# Patient Record
Sex: Male | Born: 1970 | Race: White | Hispanic: No | Smoking: Former smoker
Health system: Southern US, Community
[De-identification: ages and names within clinical notes are randomized; demographics above are authoritative.]

## PROBLEM LIST (undated history)

## (undated) DIAGNOSIS — I10 Essential (primary) hypertension: Secondary | ICD-10-CM

## (undated) DIAGNOSIS — E119 Type 2 diabetes mellitus without complications: Secondary | ICD-10-CM

---
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
## 2018-04-27 ENCOUNTER — Inpatient Hospital Stay (HOSPITAL_COMMUNITY)
Admission: EM | Admit: 2018-04-27 | Discharge: 2018-05-02 | DRG: 565 | Disposition: A | Discharge status: Rehabilitation Facility | Payer: Self-pay | Attending: Orthopedic Surgery | Admitting: Orthopedic Surgery

## 2018-04-27 ENCOUNTER — Emergency Department (HOSPITAL_COMMUNITY): Payer: Self-pay

## 2018-04-27 ENCOUNTER — Encounter (HOSPITAL_COMMUNITY): Payer: Self-pay | Admitting: Emergency Medicine

## 2018-04-27 ENCOUNTER — Other Ambulatory Visit: Payer: Self-pay

## 2018-04-27 DIAGNOSIS — I10 Essential (primary) hypertension: Secondary | ICD-10-CM | POA: Diagnosis present

## 2018-04-27 DIAGNOSIS — Z87891 Personal history of nicotine dependence: Secondary | ICD-10-CM

## 2018-04-27 DIAGNOSIS — S2231XA Fracture of one rib, right side, initial encounter for closed fracture: Secondary | ICD-10-CM | POA: Diagnosis present

## 2018-04-27 DIAGNOSIS — E1169 Type 2 diabetes mellitus with other specified complication: Secondary | ICD-10-CM

## 2018-04-27 DIAGNOSIS — S40812A Abrasion of left upper arm, initial encounter: Secondary | ICD-10-CM | POA: Diagnosis present

## 2018-04-27 DIAGNOSIS — D62 Acute posthemorrhagic anemia: Secondary | ICD-10-CM

## 2018-04-27 DIAGNOSIS — Z98811 Dental restoration status: Secondary | ICD-10-CM

## 2018-04-27 DIAGNOSIS — T07XXXA Unspecified multiple injuries, initial encounter: Secondary | ICD-10-CM

## 2018-04-27 DIAGNOSIS — E669 Obesity, unspecified: Secondary | ICD-10-CM

## 2018-04-27 DIAGNOSIS — M25511 Pain in right shoulder: Secondary | ICD-10-CM

## 2018-04-27 DIAGNOSIS — S0231XA Fracture of orbital floor, right side, initial encounter for closed fracture: Secondary | ICD-10-CM | POA: Diagnosis present

## 2018-04-27 DIAGNOSIS — Z79899 Other long term (current) drug therapy: Secondary | ICD-10-CM

## 2018-04-27 DIAGNOSIS — S80212A Abrasion, left knee, initial encounter: Secondary | ICD-10-CM | POA: Diagnosis present

## 2018-04-27 DIAGNOSIS — G8918 Other acute postprocedural pain: Secondary | ICD-10-CM

## 2018-04-27 DIAGNOSIS — S80211A Abrasion, right knee, initial encounter: Secondary | ICD-10-CM | POA: Diagnosis present

## 2018-04-27 DIAGNOSIS — S3210XA Unspecified fracture of sacrum, initial encounter for closed fracture: Secondary | ICD-10-CM | POA: Diagnosis present

## 2018-04-27 DIAGNOSIS — S0121XA Laceration without foreign body of nose, initial encounter: Secondary | ICD-10-CM | POA: Diagnosis present

## 2018-04-27 DIAGNOSIS — Z7984 Long term (current) use of oral hypoglycemic drugs: Secondary | ICD-10-CM

## 2018-04-27 DIAGNOSIS — E119 Type 2 diabetes mellitus without complications: Secondary | ICD-10-CM | POA: Diagnosis present

## 2018-04-27 DIAGNOSIS — S42111A Displaced fracture of body of scapula, right shoulder, initial encounter for closed fracture: Principal | ICD-10-CM | POA: Diagnosis present

## 2018-04-27 DIAGNOSIS — R112 Nausea with vomiting, unspecified: Secondary | ICD-10-CM

## 2018-04-27 DIAGNOSIS — Y92410 Unspecified street and highway as the place of occurrence of the external cause: Secondary | ICD-10-CM

## 2018-04-27 DIAGNOSIS — Z23 Encounter for immunization: Secondary | ICD-10-CM

## 2018-04-27 DIAGNOSIS — S40811A Abrasion of right upper arm, initial encounter: Secondary | ICD-10-CM | POA: Diagnosis present

## 2018-04-27 HISTORY — DX: Type 2 diabetes mellitus without complications: E11.9

## 2018-04-27 HISTORY — DX: Essential (primary) hypertension: I10

## 2018-04-27 LAB — COMPREHENSIVE METABOLIC PANEL
ALK PHOS: 42 U/L (ref 38–126)
ALT: 106 U/L — ABNORMAL HIGH (ref 17–63)
AST: 107 U/L — AB (ref 15–41)
Albumin: 3.8 g/dL (ref 3.5–5.0)
Anion gap: 12 (ref 5–15)
BILIRUBIN TOTAL: 0.6 mg/dL (ref 0.3–1.2)
BUN: 16 mg/dL (ref 6–20)
CALCIUM: 9.4 mg/dL (ref 8.9–10.3)
CHLORIDE: 103 mmol/L (ref 101–111)
CO2: 23 mmol/L (ref 22–32)
Creatinine, Ser: 1.25 mg/dL — ABNORMAL HIGH (ref 0.61–1.24)
Glucose, Bld: 164 mg/dL — ABNORMAL HIGH (ref 65–99)
Potassium: 3.5 mmol/L (ref 3.5–5.1)
Sodium: 138 mmol/L (ref 135–145)
Total Protein: 6.8 g/dL (ref 6.5–8.1)

## 2018-04-27 LAB — I-STAT CHEM 8, ED
BUN: 20 mg/dL (ref 6–20)
CHLORIDE: 102 mmol/L (ref 101–111)
Calcium, Ion: 1.17 mmol/L (ref 1.15–1.40)
Creatinine, Ser: 1.1 mg/dL (ref 0.61–1.24)
GLUCOSE: 159 mg/dL — AB (ref 65–99)
HEMATOCRIT: 42 % (ref 39.0–52.0)
Hemoglobin: 14.3 g/dL (ref 13.0–17.0)
POTASSIUM: 3.6 mmol/L (ref 3.5–5.1)
Sodium: 140 mmol/L (ref 135–145)
TCO2: 23 mmol/L (ref 22–32)

## 2018-04-27 LAB — GLUCOSE, CAPILLARY: GLUCOSE-CAPILLARY: 149 mg/dL — AB (ref 65–99)

## 2018-04-27 LAB — CBC
HCT: 40.7 % (ref 39.0–52.0)
Hemoglobin: 13.8 g/dL (ref 13.0–17.0)
MCH: 30.3 pg (ref 26.0–34.0)
MCHC: 33.9 g/dL (ref 30.0–36.0)
MCV: 89.5 fL (ref 78.0–100.0)
PLATELETS: 315 10*3/uL (ref 150–400)
RBC: 4.55 MIL/uL (ref 4.22–5.81)
RDW: 13.1 % (ref 11.5–15.5)
WBC: 17.1 10*3/uL — ABNORMAL HIGH (ref 4.0–10.5)

## 2018-04-27 LAB — CDS SEROLOGY

## 2018-04-27 LAB — PROTIME-INR
INR: 1.03
Prothrombin Time: 13.4 seconds (ref 11.4–15.2)

## 2018-04-27 LAB — ETHANOL: Alcohol, Ethyl (B): <10 mg/dL (ref <10)

## 2018-04-27 LAB — SAMPLE TO BLOOD BANK

## 2018-04-27 MED ORDER — DIPHENHYDRAMINE HCL 50 MG/ML IJ SOLN: 12.5000 mg | Freq: Four times a day (QID) | INTRAMUSCULAR | Status: DC | PRN

## 2018-04-27 MED ORDER — FENTANYL CITRATE (PF) 100 MCG/2ML IJ SOLN
INTRAMUSCULAR | Status: AC
  Filled 2018-04-27: qty 2

## 2018-04-27 MED ORDER — FENTANYL CITRATE (PF) 100 MCG/2ML IJ SOLN
INTRAMUSCULAR | Status: AC
  Administered 2018-04-27: 50 ug
  Filled 2018-04-27: qty 2

## 2018-04-27 MED ORDER — AMLODIPINE BESYLATE 10 MG PO TABS
10.0000 mg | ORAL_TABLET | Freq: Every day | ORAL | Status: DC
  Administered 2018-04-28 – 2018-05-02 (×4): 10 mg via ORAL
  Filled 2018-04-27 (×5): qty 1

## 2018-04-27 MED ORDER — ENOXAPARIN SODIUM 40 MG/0.4ML ~~LOC~~ SOLN
40.0000 mg | SUBCUTANEOUS | Status: DC
  Administered 2018-04-28 – 2018-05-02 (×5): 40 mg via SUBCUTANEOUS
  Filled 2018-04-27 (×5): qty 0.4

## 2018-04-27 MED ORDER — OXYCODONE HCL 5 MG PO TABS
5.0000 mg | ORAL_TABLET | ORAL | Status: DC | PRN
  Administered 2018-04-28 – 2018-05-02 (×10): 10 mg via ORAL
  Filled 2018-04-27 (×10): qty 2

## 2018-04-27 MED ORDER — ONDANSETRON HCL 4 MG/2ML IJ SOLN
4.0000 mg | Freq: Once | INTRAMUSCULAR | Status: DC
  Filled 2018-04-27: qty 2

## 2018-04-27 MED ORDER — KCL IN DEXTROSE-NACL 20-5-0.45 MEQ/L-%-% IV SOLN
INTRAVENOUS | Status: DC
  Administered 2018-04-27 – 2018-05-01 (×10): via INTRAVENOUS
  Filled 2018-04-27 (×11): qty 1000

## 2018-04-27 MED ORDER — BACITRACIN ZINC 500 UNIT/GM EX OINT
TOPICAL_OINTMENT | Freq: Two times a day (BID) | CUTANEOUS | Status: DC
  Administered 2018-04-27 – 2018-04-29 (×5): via TOPICAL
  Administered 2018-04-30: 1 via TOPICAL
  Administered 2018-04-30 – 2018-05-02 (×4): via TOPICAL
  Filled 2018-04-27 (×2): qty 28.35

## 2018-04-27 MED ORDER — LIDOCAINE-EPINEPHRINE (PF) 2 %-1:200000 IJ SOLN
10.0000 mL | Freq: Once | INTRAMUSCULAR | Status: AC
  Administered 2018-04-27: 10 mL

## 2018-04-27 MED ORDER — DOCUSATE SODIUM 100 MG PO CAPS
100.0000 mg | ORAL_CAPSULE | Freq: Two times a day (BID) | ORAL | Status: DC
  Administered 2018-04-28 – 2018-05-02 (×8): 100 mg via ORAL
  Filled 2018-04-27 (×9): qty 1

## 2018-04-27 MED ORDER — INSULIN ASPART 100 UNIT/ML ~~LOC~~ SOLN
0.0000 [IU] | Freq: Three times a day (TID) | SUBCUTANEOUS | Status: DC
  Administered 2018-04-28: 2 [IU] via SUBCUTANEOUS
  Administered 2018-04-28: 3 [IU] via SUBCUTANEOUS
  Administered 2018-04-28 – 2018-05-02 (×7): 2 [IU] via SUBCUTANEOUS

## 2018-04-27 MED ORDER — TRAMADOL HCL 50 MG PO TABS
50.0000 mg | ORAL_TABLET | Freq: Four times a day (QID) | ORAL | Status: DC | PRN
  Administered 2018-04-28: 50 mg via ORAL
  Filled 2018-04-27: qty 1

## 2018-04-27 MED ORDER — LOSARTAN POTASSIUM 50 MG PO TABS
100.0000 mg | ORAL_TABLET | Freq: Every day | ORAL | Status: DC
  Administered 2018-04-28 – 2018-05-02 (×5): 100 mg via ORAL
  Filled 2018-04-27 (×5): qty 2

## 2018-04-27 MED ORDER — CEFAZOLIN SODIUM-DEXTROSE 2-4 GM/100ML-% IV SOLN
2.0000 g | Freq: Three times a day (TID) | INTRAVENOUS | Status: AC
  Administered 2018-04-27 – 2018-04-28 (×3): 2 g via INTRAVENOUS
  Filled 2018-04-27 (×3): qty 100

## 2018-04-27 MED ORDER — ACETAMINOPHEN 325 MG PO TABS: 650.0000 mg | ORAL_TABLET | Freq: Four times a day (QID) | ORAL | Status: DC | PRN

## 2018-04-27 MED ORDER — HYDROMORPHONE HCL 2 MG/ML IJ SOLN
0.5000 mg | Freq: Once | INTRAMUSCULAR | Status: AC
  Administered 2018-04-27: 0.5 mg via INTRAVENOUS
  Filled 2018-04-27: qty 1

## 2018-04-27 MED ORDER — HYDROCHLOROTHIAZIDE 25 MG PO TABS
25.0000 mg | ORAL_TABLET | Freq: Every day | ORAL | Status: DC
  Administered 2018-04-28 – 2018-05-02 (×5): 25 mg via ORAL
  Filled 2018-04-27 (×5): qty 1

## 2018-04-27 MED ORDER — ONDANSETRON HCL 4 MG/2ML IJ SOLN
4.0000 mg | Freq: Four times a day (QID) | INTRAMUSCULAR | Status: DC | PRN
  Administered 2018-04-29 – 2018-05-01 (×2): 4 mg via INTRAVENOUS
  Filled 2018-04-27 (×2): qty 2

## 2018-04-27 MED ORDER — HYDROMORPHONE HCL 2 MG/ML IJ SOLN
0.5000 mg | INTRAMUSCULAR | Status: DC | PRN
  Administered 2018-04-27: 2 mg via INTRAVENOUS
  Filled 2018-04-27: qty 1

## 2018-04-27 MED ORDER — ONDANSETRON 4 MG PO TBDP: 4.0000 mg | ORAL_TABLET | Freq: Four times a day (QID) | ORAL | Status: DC | PRN

## 2018-04-27 MED ORDER — FENTANYL CITRATE (PF) 100 MCG/2ML IJ SOLN
50.0000 ug | Freq: Once | INTRAMUSCULAR | Status: AC
  Administered 2018-04-27: 50 ug via INTRAVENOUS

## 2018-04-27 MED ORDER — BISACODYL 10 MG RE SUPP: 10.0000 mg | Freq: Every day | RECTAL | Status: DC | PRN

## 2018-04-27 MED ORDER — IOHEXOL 300 MG/ML  SOLN
100.0000 mL | Freq: Once | INTRAMUSCULAR | Status: AC | PRN
  Administered 2018-04-27: 100 mL via INTRAVENOUS

## 2018-04-27 MED ORDER — LIDOCAINE-EPINEPHRINE 1 %-1:100000 IJ SOLN
20.0000 mL | Freq: Once | INTRAMUSCULAR | Status: DC
  Filled 2018-04-27: qty 20

## 2018-04-27 MED ORDER — HYDRALAZINE HCL 20 MG/ML IJ SOLN: 10.0000 mg | INTRAMUSCULAR | Status: DC | PRN

## 2018-04-27 MED ORDER — PRAVASTATIN SODIUM 40 MG PO TABS
40.0000 mg | ORAL_TABLET | Freq: Every day | ORAL | Status: DC
  Administered 2018-04-28 – 2018-05-01 (×4): 40 mg via ORAL
  Filled 2018-04-27 (×4): qty 1

## 2018-04-27 MED ORDER — FENOFIBRATE 54 MG PO TABS
54.0000 mg | ORAL_TABLET | Freq: Every day | ORAL | Status: DC
  Administered 2018-04-28 – 2018-05-02 (×5): 54 mg via ORAL
  Filled 2018-04-27 (×5): qty 1
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
## 2018-04-27 NOTE — ED Notes (Signed)
Report given to Tor Netters RN

## 2018-04-27 NOTE — ED Notes (Signed)
Pt to CT with transporter.
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
## 2018-04-27 NOTE — ED Notes (Signed)
Sling placed on patient's right arm.
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
## 2018-04-27 NOTE — ED Notes (Signed)
Report given to [facility]. Opportunity for questions provided.  RN aware that he is being transferred to floor.

## 2018-04-27 NOTE — Progress Notes (Signed)
Orthopedic Tech Progress Note Patient Details:  Dustin Alexander September 20, 1971 [id number]  Ortho Devices Type of Ortho Device: Arm sling Ortho Device/Splint Location: rue. pt still needed to have wounds dressed so i left arm sling in room and informed rn to this. Ortho Device/Splint Interventions: Rich Brave 04/27/2018, 8:49 PM

## 2018-04-27 NOTE — Progress Notes (Signed)
Chaplain visited with the PT and helped him dial his cell phone.  Both Attending and Chaplain spoke with wife who is traveling from [place].

## 2018-04-28 ENCOUNTER — Encounter (HOSPITAL_COMMUNITY): Payer: Self-pay | Admitting: General Practice

## 2018-04-28 ENCOUNTER — Encounter (HOSPITAL_COMMUNITY): Payer: Self-pay

## 2018-04-28 LAB — GLUCOSE, CAPILLARY
GLUCOSE-CAPILLARY: 136 mg/dL — AB (ref 65–99)
Glucose-Capillary: 143 mg/dL — ABNORMAL HIGH (ref 65–99)
Glucose-Capillary: 168 mg/dL — ABNORMAL HIGH (ref 65–99)
Glucose-Capillary: 143 mg/dL — ABNORMAL HIGH (ref 65–99)

## 2018-04-28 LAB — CBC
HEMATOCRIT: 36 % — AB (ref 39.0–52.0)
HEMOGLOBIN: 12.3 g/dL — AB (ref 13.0–17.0)
MCH: 30.4 pg (ref 26.0–34.0)
MCHC: 34.2 g/dL (ref 30.0–36.0)
MCV: 89.1 fL (ref 78.0–100.0)
Platelets: 240 10*3/uL (ref 150–400)
RBC: 4.04 MIL/uL — ABNORMAL LOW (ref 4.22–5.81)
RDW: 13.4 % (ref 11.5–15.5)
WBC: 9.7 10*3/uL (ref 4.0–10.5)

## 2018-04-28 LAB — BASIC METABOLIC PANEL
Anion gap: 10 (ref 5–15)
BUN: 19 mg/dL (ref 6–20)
CALCIUM: 9 mg/dL (ref 8.9–10.3)
CHLORIDE: 102 mmol/L (ref 101–111)
CO2: 25 mmol/L (ref 22–32)
CREATININE: 1.1 mg/dL (ref 0.61–1.24)
GFR calc non Af Amer: >60 mL/min (ref >60)
Glucose, Bld: 150 mg/dL — ABNORMAL HIGH (ref 65–99)
Potassium: 3.6 mmol/L (ref 3.5–5.1)
Sodium: 137 mmol/L (ref 135–145)

## 2018-04-28 LAB — HIV ANTIBODY (ROUTINE TESTING W REFLEX): HIV Screen 4th Generation wRfx: NONREACTIVE

## 2018-04-28 MED ORDER — HYDROMORPHONE HCL 2 MG/ML IJ SOLN
1.0000 mg | INTRAMUSCULAR | Status: DC | PRN
  Administered 2018-04-28: 1 mg via INTRAVENOUS
  Filled 2018-04-28: qty 1

## 2018-04-28 MED ORDER — ACETAMINOPHEN 325 MG PO TABS
650.0000 mg | ORAL_TABLET | Freq: Four times a day (QID) | ORAL | Status: DC
  Administered 2018-04-28 – 2018-05-02 (×17): 650 mg via ORAL
  Filled 2018-04-28 (×18): qty 2

## 2018-04-28 MED ORDER — TRAMADOL HCL 50 MG PO TABS
50.0000 mg | ORAL_TABLET | Freq: Four times a day (QID) | ORAL | Status: DC
  Administered 2018-04-28 – 2018-05-02 (×16): 50 mg via ORAL
  Filled 2018-04-28 (×16): qty 1

## 2018-04-28 NOTE — Progress Notes (Signed)
Orthopedic Tech Progress Note Patient Details:  Dustin Alexander Oct 25, 1971 [id number]  Ortho Devices Type of Ortho Device: Sling immobilizer Ortho Device/Splint Location: rue. pt still needed to have wounds dressed so i left arm sling in room and informed rn to this. Ortho Device/Splint Interventions: Ordered   Post Interventions Instructions Provided: Care of device   Saul Fordyce 04/28/2018, 11:03 AM
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
## 2018-04-28 NOTE — Progress Notes (Signed)
Orthopedic Tech Progress Note Patient Details:  Dustin Alexander December 21, 1971 [id number] Patient unable to use frame. Patient ID: Dustin Alexander, male   DOB: January 06, 1971, 47 y.o.   MRN: [id number]   [name] 04/28/2018, 7:35 PM
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
## 2018-04-28 NOTE — Progress Notes (Signed)
Received pt alert and oriented. Pt with sling to right arm, gauze dressing to right hand. Abrasions noted to back,left arms a sacral area, bilateral knees and toes.
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
## 2018-04-29 ENCOUNTER — Encounter (HOSPITAL_COMMUNITY): Payer: Self-pay | Admitting: Physical Medicine and Rehabilitation

## 2018-04-29 DIAGNOSIS — S42191S Fracture of other part of scapula, right shoulder, sequela: Secondary | ICD-10-CM

## 2018-04-29 DIAGNOSIS — S3219XS Other fracture of sacrum, sequela: Secondary | ICD-10-CM

## 2018-04-29 LAB — GLUCOSE, CAPILLARY
GLUCOSE-CAPILLARY: 115 mg/dL — AB (ref 65–99)
GLUCOSE-CAPILLARY: 123 mg/dL — AB (ref 65–99)
Glucose-Capillary: 124 mg/dL — ABNORMAL HIGH (ref 65–99)
Glucose-Capillary: 134 mg/dL — ABNORMAL HIGH (ref 65–99)
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
## 2018-04-30 LAB — GLUCOSE, CAPILLARY
Glucose-Capillary: 117 mg/dL — ABNORMAL HIGH (ref 65–99)
Glucose-Capillary: 145 mg/dL — ABNORMAL HIGH (ref 65–99)
Glucose-Capillary: 116 mg/dL — ABNORMAL HIGH (ref 65–99)
Glucose-Capillary: 129 mg/dL — ABNORMAL HIGH (ref 65–99)

## 2018-04-30 MED ORDER — METHOCARBAMOL 500 MG PO TABS
1000.0000 mg | ORAL_TABLET | Freq: Four times a day (QID) | ORAL | Status: DC | PRN
  Administered 2018-04-30 – 2018-05-02 (×4): 1000 mg via ORAL
  Filled 2018-04-30 (×4): qty 2
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
## 2018-04-30 NOTE — Progress Notes (Deleted)
Subjective:  Patient comfortable.  In a collar.   Objective: Vital signs in last 24 hours: Temp:  [98.4 F (36.9 C)-99.1 F (37.3 C)] 99.1 F (37.3 C) (05/11 0625) Pulse Rate:  [75-81] 81 (05/11 0625) BP: (103-105)/(62-67) 105/67 (05/11 0625) SpO2:  [95 %-96 %] 95 % (05/11 0625)  Intake/Output from previous day: 05/10 0701 - 05/11 0700 In: 3200 [P.O.:300; I.V.:2900] Out: 2600 [Urine:2600] Intake/Output this shift: No intake/output data recorded.  Recent Labs    04/27/18 1434 04/27/18 1455 04/28/18 0719  HGB 13.8 14.3 12.3*   Recent Labs    04/27/18 1434 04/27/18 1455 04/28/18 0719  WBC 17.1*  --  9.7  RBC 4.55  --  4.04*  HCT 40.7 42.0 36.0*  PLT 315  --  240   Recent Labs    04/27/18 1434 04/27/18 1455 04/28/18 0719  NA 138 140 137  K 3.5 3.6 3.6  CL 103 102 102  CO2 23  --  25  BUN CREATININE 1.25* 1.10 1.10  GLUCOSE 164* 159* 150*  CALCIUM 9.4  --  9.0   Recent Labs    04/27/18 1434  INR 1.03    External fixator on the left tibial plateau fracture.  Dressing applied to it.  There is some mild bloody drainage posteriorly.  Good capillary refill.  Good sensation distally.  Able dorsiflex and plantarflex.  No pain with passive dorsiflexion or plantar flexion of the left foot.  Right knee moderate effusion.  No pain with passive dorsiflexion and plantarflexion of the foot or the great toe.  Compartment soft.  Good sensation distally on the right and good capillary refill.  Good pulses.      Assessment/Plan:  Patient status post I&D and application of external fixator for open comminuted left proximal tibia fracture.  Patient neurovascular intact without evidence of a compartment syndrome on the left.  On the right is a moderate knee effusion.  No evidence of a compartment syndrome. Patient is currently in a collar pending MRI of the cervical spine for further evaluation by neurosurgery.  I discussed local wound care with the nurse.   He has multiple abrasions in the upper extremities.  Continue monitoring.   Nyla Creason C 04/30/2018, 11:40 AM

## 2018-05-01 LAB — GLUCOSE, CAPILLARY
GLUCOSE-CAPILLARY: 119 mg/dL — AB (ref 65–99)
GLUCOSE-CAPILLARY: 127 mg/dL — AB (ref 65–99)
GLUCOSE-CAPILLARY: 167 mg/dL — AB (ref 65–99)
Glucose-Capillary: 143 mg/dL — ABNORMAL HIGH (ref 65–99)

## 2018-05-01 NOTE — Progress Notes (Signed)
Subjective: [name] without new complaints. Seems fairly comfortable on rounds.   Objective: Vital signs in last 24 hours: Temp:  [98.4 F (36.9 C)-100 F (37.8 C)] 98.4 F (36.9 C) (05/12 0509) Pulse Rate:  [69-86] 69 (05/12 0509) Resp:  [16] 16 (05/11 2248) BP: (114-130)/(72-74) 125/74 (05/12 0509) SpO2:  [92 %-97 %] 97 % (05/12 0509)  Intake/Output from previous day: 05/11 0701 - 05/12 0700 In: -  Out: 1600 [Urine:1600] Intake/Output this shift: Total I/O In: 300 [P.O.:300] Out: -   No results for input(s): HGB in the last 72 hours. No results for input(s): WBC, RBC, HCT, PLT in the last 72 hours. No results for input(s): NA, K, CL, CO2, BUN, CREATININE, GLUCOSE, CALCIUM in the last 72 hours. No results for input(s): LABPT, INR in the last 72 hours.  Sling inplace . Shoulder and hips sore . Tolerating therapies  well.     Assessment/Plan: Please see DR Ranell Patrick note yesterday from 5/10   No change. Dustin Alexander 05/01/2018, 10:27 AM

## 2018-05-02 ENCOUNTER — Inpatient Hospital Stay (HOSPITAL_COMMUNITY)
Admission: RE | Admit: 2018-05-02 | Discharge: 2018-05-07 | DRG: 560 | Disposition: A | Discharge status: Home / Self Care | Payer: Self-pay | Source: Intra-hospital | Attending: Physical Medicine & Rehabilitation | Admitting: Physical Medicine & Rehabilitation

## 2018-05-02 ENCOUNTER — Encounter (HOSPITAL_COMMUNITY): Payer: Self-pay | Admitting: *Deleted

## 2018-05-02 ENCOUNTER — Other Ambulatory Visit: Payer: Self-pay

## 2018-05-02 DIAGNOSIS — I10 Essential (primary) hypertension: Secondary | ICD-10-CM | POA: Diagnosis present

## 2018-05-02 DIAGNOSIS — S80212D Abrasion, left knee, subsequent encounter: Secondary | ICD-10-CM

## 2018-05-02 DIAGNOSIS — R7989 Other specified abnormal findings of blood chemistry: Secondary | ICD-10-CM

## 2018-05-02 DIAGNOSIS — S42102D Fracture of unspecified part of scapula, left shoulder, subsequent encounter for fracture with routine healing: Secondary | ICD-10-CM

## 2018-05-02 DIAGNOSIS — E1169 Type 2 diabetes mellitus with other specified complication: Secondary | ICD-10-CM | POA: Diagnosis present

## 2018-05-02 DIAGNOSIS — Z7984 Long term (current) use of oral hypoglycemic drugs: Secondary | ICD-10-CM

## 2018-05-02 DIAGNOSIS — Z6830 Body mass index (BMI) 30.0-30.9, adult: Secondary | ICD-10-CM

## 2018-05-02 DIAGNOSIS — S0121XD Laceration without foreign body of nose, subsequent encounter: Secondary | ICD-10-CM

## 2018-05-02 DIAGNOSIS — E669 Obesity, unspecified: Secondary | ICD-10-CM

## 2018-05-02 DIAGNOSIS — T07XXXA Unspecified multiple injuries, initial encounter: Secondary | ICD-10-CM

## 2018-05-02 DIAGNOSIS — S80211D Abrasion, right knee, subsequent encounter: Secondary | ICD-10-CM

## 2018-05-02 DIAGNOSIS — S3210XD Unspecified fracture of sacrum, subsequent encounter for fracture with routine healing: Secondary | ICD-10-CM

## 2018-05-02 DIAGNOSIS — S20411D Abrasion of right back wall of thorax, subsequent encounter: Secondary | ICD-10-CM

## 2018-05-02 DIAGNOSIS — S0231XD Fracture of orbital floor, right side, subsequent encounter for fracture with routine healing: Secondary | ICD-10-CM

## 2018-05-02 DIAGNOSIS — S3282XD Multiple fractures of pelvis without disruption of pelvic ring, subsequent encounter for fracture with routine healing: Principal | ICD-10-CM

## 2018-05-02 DIAGNOSIS — E119 Type 2 diabetes mellitus without complications: Secondary | ICD-10-CM | POA: Diagnosis present

## 2018-05-02 DIAGNOSIS — S50811D Abrasion of right forearm, subsequent encounter: Secondary | ICD-10-CM

## 2018-05-02 DIAGNOSIS — R945 Abnormal results of liver function studies: Secondary | ICD-10-CM | POA: Diagnosis present

## 2018-05-02 DIAGNOSIS — R112 Nausea with vomiting, unspecified: Secondary | ICD-10-CM

## 2018-05-02 DIAGNOSIS — D62 Acute posthemorrhagic anemia: Secondary | ICD-10-CM | POA: Diagnosis present

## 2018-05-02 DIAGNOSIS — S2231XD Fracture of one rib, right side, subsequent encounter for fracture with routine healing: Secondary | ICD-10-CM

## 2018-05-02 DIAGNOSIS — G8918 Other acute postprocedural pain: Secondary | ICD-10-CM

## 2018-05-02 DIAGNOSIS — Z87891 Personal history of nicotine dependence: Secondary | ICD-10-CM

## 2018-05-02 DIAGNOSIS — K5901 Slow transit constipation: Secondary | ICD-10-CM | POA: Diagnosis present

## 2018-05-02 DIAGNOSIS — Y92410 Unspecified street and highway as the place of occurrence of the external cause: Secondary | ICD-10-CM

## 2018-05-02 DIAGNOSIS — S50812D Abrasion of left forearm, subsequent encounter: Secondary | ICD-10-CM

## 2018-05-02 DIAGNOSIS — S3282XA Multiple fractures of pelvis without disruption of pelvic ring, initial encounter for closed fracture: Secondary | ICD-10-CM | POA: Diagnosis present

## 2018-05-02 LAB — GLUCOSE, CAPILLARY
GLUCOSE-CAPILLARY: 125 mg/dL — AB (ref 65–99)
Glucose-Capillary: 119 mg/dL — ABNORMAL HIGH (ref 65–99)
Glucose-Capillary: 113 mg/dL — ABNORMAL HIGH (ref 65–99)
Glucose-Capillary: 120 mg/dL — ABNORMAL HIGH (ref 65–99)

## 2018-05-02 MED ORDER — INSULIN ASPART 100 UNIT/ML ~~LOC~~ SOLN
0.0000 [IU] | Freq: Three times a day (TID) | SUBCUTANEOUS | Status: DC
  Administered 2018-05-03 – 2018-05-06 (×4): 2 [IU] via SUBCUTANEOUS
  Administered 2018-05-07: 3 [IU] via SUBCUTANEOUS

## 2018-05-02 MED ORDER — ENOXAPARIN SODIUM 40 MG/0.4ML ~~LOC~~ SOLN: 40.0000 mg | SUBCUTANEOUS | Status: DC

## 2018-05-02 MED ORDER — ALUM & MAG HYDROXIDE-SIMETH 200-200-20 MG/5ML PO SUSP: 30.0000 mL | ORAL | Status: DC | PRN

## 2018-05-02 MED ORDER — PROCHLORPERAZINE 25 MG RE SUPP: 12.5000 mg | Freq: Four times a day (QID) | RECTAL | Status: DC | PRN

## 2018-05-02 MED ORDER — PROCHLORPERAZINE MALEATE 5 MG PO TABS: 5.0000 mg | ORAL_TABLET | Freq: Four times a day (QID) | ORAL | Status: DC | PRN

## 2018-05-02 MED ORDER — COLLAGENASE 250 UNIT/GM EX OINT
TOPICAL_OINTMENT | Freq: Every day | CUTANEOUS | Status: DC
  Administered 2018-05-02: 18:00:00 via TOPICAL
  Administered 2018-05-03: 1 via TOPICAL
  Administered 2018-05-04 – 2018-05-06 (×2): via TOPICAL
  Filled 2018-05-02 (×2): qty 30

## 2018-05-02 MED ORDER — LOSARTAN POTASSIUM 50 MG PO TABS
100.0000 mg | ORAL_TABLET | Freq: Every day | ORAL | Status: DC
  Administered 2018-05-03 – 2018-05-07 (×5): 100 mg via ORAL
  Filled 2018-05-02 (×5): qty 2

## 2018-05-02 MED ORDER — FENOFIBRATE 54 MG PO TABS
54.0000 mg | ORAL_TABLET | Freq: Every day | ORAL | Status: DC
  Administered 2018-05-03 – 2018-05-07 (×5): 54 mg via ORAL
  Filled 2018-05-02 (×6): qty 1

## 2018-05-02 MED ORDER — HYDROCHLOROTHIAZIDE 25 MG PO TABS
25.0000 mg | ORAL_TABLET | Freq: Every day | ORAL | Status: DC
  Administered 2018-05-03 – 2018-05-07 (×5): 25 mg via ORAL
  Filled 2018-05-02 (×5): qty 1

## 2018-05-02 MED ORDER — BACITRACIN ZINC 500 UNIT/GM EX OINT
TOPICAL_OINTMENT | Freq: Two times a day (BID) | CUTANEOUS | Status: DC
  Administered 2018-05-03: 1 via TOPICAL
  Administered 2018-05-03 – 2018-05-07 (×8): via TOPICAL
  Filled 2018-05-02 (×3): qty 28.35

## 2018-05-02 MED ORDER — OXYCODONE HCL 5 MG PO TABS
5.0000 mg | ORAL_TABLET | ORAL | Status: DC | PRN
  Administered 2018-05-02 – 2018-05-07 (×7): 10 mg via ORAL
  Filled 2018-05-02 (×7): qty 2

## 2018-05-02 MED ORDER — METHOCARBAMOL 500 MG PO TABS
1000.0000 mg | ORAL_TABLET | Freq: Four times a day (QID) | ORAL | Status: DC | PRN
  Administered 2018-05-03: 1000 mg via ORAL
  Filled 2018-05-02: qty 2

## 2018-05-02 MED ORDER — POLYETHYLENE GLYCOL 3350 17 G PO PACK: 17.0000 g | PACK | Freq: Every day | ORAL | Status: DC | PRN

## 2018-05-02 MED ORDER — PREMIER PROTEIN SHAKE
11.0000 [oz_av] | Freq: Three times a day (TID) | ORAL | Status: DC
  Administered 2018-05-02 – 2018-05-06 (×13): 11 [oz_av] via ORAL
  Filled 2018-05-02 (×34): qty 325.31

## 2018-05-02 MED ORDER — DIPHENHYDRAMINE HCL 12.5 MG/5ML PO ELIX: 12.5000 mg | ORAL_SOLUTION | Freq: Four times a day (QID) | ORAL | Status: DC | PRN

## 2018-05-02 MED ORDER — PROCHLORPERAZINE EDISYLATE 10 MG/2ML IJ SOLN: 5.0000 mg | Freq: Four times a day (QID) | INTRAMUSCULAR | Status: DC | PRN

## 2018-05-02 MED ORDER — POLYETHYLENE GLYCOL 3350 17 G PO PACK
17.0000 g | PACK | Freq: Two times a day (BID) | ORAL | Status: DC
  Administered 2018-05-03 – 2018-05-07 (×9): 17 g via ORAL
  Filled 2018-05-02 (×10): qty 1

## 2018-05-02 MED ORDER — FLEET ENEMA 7-19 GM/118ML RE ENEM: 1.0000 | ENEMA | Freq: Once | RECTAL | Status: DC | PRN

## 2018-05-02 MED ORDER — TRAZODONE HCL 50 MG PO TABS: 25.0000 mg | ORAL_TABLET | Freq: Every evening | ORAL | Status: DC | PRN

## 2018-05-02 MED ORDER — PRAVASTATIN SODIUM 40 MG PO TABS
40.0000 mg | ORAL_TABLET | Freq: Every day | ORAL | Status: DC
  Administered 2018-05-02 – 2018-05-06 (×5): 40 mg via ORAL
  Filled 2018-05-02 (×6): qty 1

## 2018-05-02 MED ORDER — GUAIFENESIN-DM 100-10 MG/5ML PO SYRP: 5.0000 mL | ORAL_SOLUTION | Freq: Four times a day (QID) | ORAL | Status: DC | PRN

## 2018-05-02 MED ORDER — ENOXAPARIN SODIUM 40 MG/0.4ML ~~LOC~~ SOLN
40.0000 mg | SUBCUTANEOUS | Status: DC
  Administered 2018-05-03 – 2018-05-07 (×5): 40 mg via SUBCUTANEOUS
  Filled 2018-05-02 (×5): qty 0.4

## 2018-05-02 MED ORDER — AMLODIPINE BESYLATE 10 MG PO TABS
10.0000 mg | ORAL_TABLET | Freq: Every day | ORAL | Status: DC
  Administered 2018-05-03 – 2018-05-07 (×5): 10 mg via ORAL
  Filled 2018-05-02 (×5): qty 1

## 2018-05-02 MED ORDER — TRAMADOL HCL 50 MG PO TABS
50.0000 mg | ORAL_TABLET | Freq: Four times a day (QID) | ORAL | Status: DC
  Administered 2018-05-02 – 2018-05-06 (×16): 50 mg via ORAL
  Filled 2018-05-02 (×16): qty 1

## 2018-05-02 MED ORDER — POLYETHYLENE GLYCOL 3350 17 G PO PACK
17.0000 g | PACK | Freq: Once | ORAL | Status: AC
  Administered 2018-05-02: 17 g via ORAL
  Filled 2018-05-02: qty 1

## 2018-05-02 MED ORDER — ACETAMINOPHEN 325 MG PO TABS
650.0000 mg | ORAL_TABLET | Freq: Four times a day (QID) | ORAL | Status: DC
  Administered 2018-05-02 – 2018-05-07 (×17): 650 mg via ORAL
  Filled 2018-05-02 (×18): qty 2

## 2018-05-02 MED ORDER — BISACODYL 10 MG RE SUPP: 10.0000 mg | Freq: Every day | RECTAL | Status: DC | PRN
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
## 2018-05-02 NOTE — Progress Notes (Signed)
Orthopedic Tech Progress Note Patient Details:  Dustin Alexander 10/10/1971 [id number]  Ortho Devices Type of Ortho Device: Arm sling Ortho Device/Splint Location: rue. pt still needed to have wounds dressed so i left arm sling in room and informed rn to this. Ortho Device/Splint Interventions: Ordered   Post Interventions Patient Tolerated: Unable to use device properly Instructions Provided: Care of device   Saul Fordyce 05/02/2018, 2:17 PM
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
## 2018-05-02 NOTE — Progress Notes (Signed)
Patient ID: Dustin Alexander, male   DOB: 04/19/1971, 47 y.o.   MRN: [id number] Patient arrived from [facility] with patient belongings and family. Patient oriented to room, rehab process, rehab schedule, fall prevention plan, rehab safety plan, and health resource notebook with verbal understanding. Patient A & OX4.
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
## 2018-05-02 NOTE — Progress Notes (Addendum)
Inpatient Rehabilitation  Met with patient and spouse at bedside to further discuss IP Rehab.  They are in agreement with plan for IP Rehab stay prior to returning home with his mother-in-law and father-in-law to assist while spouse works.  Plan to follow for timing of medical readiness and IP Rehab bed availability.  Call if questions.    Update: I have received medical clearance and have a bed to offer today.  Plan to proceed with IP Rehab admission today.   Carmelia Roller., CCC/SLP Admission Coordinator  [place]  Cell [phone]
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
## 2018-05-03 ENCOUNTER — Encounter (HOSPITAL_COMMUNITY): Payer: Self-pay

## 2018-05-03 ENCOUNTER — Inpatient Hospital Stay (HOSPITAL_COMMUNITY): Payer: Self-pay | Admitting: Physical Therapy

## 2018-05-03 ENCOUNTER — Inpatient Hospital Stay (HOSPITAL_COMMUNITY): Payer: Self-pay

## 2018-05-03 DIAGNOSIS — E119 Type 2 diabetes mellitus without complications: Secondary | ICD-10-CM

## 2018-05-03 DIAGNOSIS — S3282XS Multiple fractures of pelvis without disruption of pelvic ring, sequela: Secondary | ICD-10-CM

## 2018-05-03 DIAGNOSIS — S42101S Fracture of unspecified part of scapula, right shoulder, sequela: Secondary | ICD-10-CM

## 2018-05-03 DIAGNOSIS — I10 Essential (primary) hypertension: Secondary | ICD-10-CM

## 2018-05-03 LAB — COMPREHENSIVE METABOLIC PANEL
ALK PHOS: 57 U/L (ref 38–126)
ALT: 100 U/L — AB (ref 17–63)
ANION GAP: 10 (ref 5–15)
AST: 67 U/L — ABNORMAL HIGH (ref 15–41)
Albumin: 3.1 g/dL — ABNORMAL LOW (ref 3.5–5.0)
BUN: 16 mg/dL (ref 6–20)
CALCIUM: 9.5 mg/dL (ref 8.9–10.3)
CO2: 26 mmol/L (ref 22–32)
CREATININE: 0.81 mg/dL (ref 0.61–1.24)
Chloride: 102 mmol/L (ref 101–111)
Glucose, Bld: 126 mg/dL — ABNORMAL HIGH (ref 65–99)
Potassium: 3.9 mmol/L (ref 3.5–5.1)
Sodium: 138 mmol/L (ref 135–145)
Total Bilirubin: 0.9 mg/dL (ref 0.3–1.2)
Total Protein: 6.8 g/dL (ref 6.5–8.1)

## 2018-05-03 LAB — CBC WITH DIFFERENTIAL/PLATELET
Basophils Absolute: 0 10*3/uL (ref 0.0–0.1)
Basophils Relative: 0 %
EOS PCT: 2 %
Eosinophils Absolute: 0.2 10*3/uL (ref 0.0–0.7)
HCT: 35.7 % — ABNORMAL LOW (ref 39.0–52.0)
Hemoglobin: 12.4 g/dL — ABNORMAL LOW (ref 13.0–17.0)
LYMPHS ABS: 1.7 10*3/uL (ref 0.7–4.0)
LYMPHS PCT: 20 %
MCH: 31.2 pg (ref 26.0–34.0)
MCHC: 34.7 g/dL (ref 30.0–36.0)
MCV: 89.7 fL (ref 78.0–100.0)
MONOS PCT: 7 %
Monocytes Absolute: 0.6 10*3/uL (ref 0.1–1.0)
Neutro Abs: 6.2 10*3/uL (ref 1.7–7.7)
Neutrophils Relative %: 71 %
Platelets: 334 10*3/uL (ref 150–400)
RBC: 3.98 MIL/uL — AB (ref 4.22–5.81)
RDW: 13.3 % (ref 11.5–15.5)
WBC: 8.8 10*3/uL (ref 4.0–10.5)

## 2018-05-03 LAB — GLUCOSE, CAPILLARY
Glucose-Capillary: 124 mg/dL — ABNORMAL HIGH (ref 65–99)
Glucose-Capillary: 125 mg/dL — ABNORMAL HIGH (ref 65–99)
Glucose-Capillary: 90 mg/dL (ref 65–99)
Glucose-Capillary: 114 mg/dL — ABNORMAL HIGH (ref 65–99)

## 2018-05-03 MED ORDER — SORBITOL 70 % SOLN: 30.0000 mL | Freq: Every day | Status: DC | PRN
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
## 2018-05-03 NOTE — Progress Notes (Signed)
Patient placed on air mattress r/t pain level, wounds, and deep tissue injury in gluteal folds. Patient tolerated transfer to new bed well. Relayed to patient that firmness to mattress can be decreased if felt to be too firm. Patient stated verbal understanding.
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
## 2018-05-03 NOTE — Progress Notes (Signed)
[facility]patient [facility] Individual Statement of Services  Patient Name:  Dustin Alexander  Date:  05/03/2018  Welcome to the [facility]patient [facility].  Our goal is to provide you with an individualized program based on your diagnosis and situation, designed to meet your specific needs.  With this comprehensive rehabilitation program, you will be expected to participate in at least 3 hours of rehabilitation therapies Monday-Friday, with modified therapy programming on the weekends.  Your rehabilitation program will include the following services:  Physical Therapy (PT), Occupational Therapy (OT), 24 hour per day rehabilitation nursing, Neuropsychology, Case Management (Social Worker), Rehabilitation Medicine, Nutrition Services and Pharmacy Services  Weekly team conferences will be held on Tuesdays to discuss your progress.  Your Social Worker will talk with you frequently to get your input and to update you on team discussions.  Team conferences with you and your family in attendance may also be held.  Expected length of stay:  7 to 10 days  Overall anticipated outcome:  Modified independent from wheelchair and supervision for transfers  Depending on your progress and recovery, your program may change. Your Social Worker will coordinate services and will keep you informed of any changes. Your Social Worker's name and contact numbers are listed  below.  The following services may also be recommended but are not provided by the Inpatient [facility] Center:   Driving Evaluations  Home Health Rehabiltiation Services  Outpatient Rehabilitation Services  Vocational Rehabilitation   Arrangements will be made to provide these services after discharge if needed.  Arrangements include referral to agencies that provide these services.  Your insurance has been verified to be:  None at this time  Your primary doctor is:  Larita Fife, NP-C  Pertinent information will be shared with  your doctor and your insurance company.  Social Worker:  Staci Acosta, LCSW  [phone] or [phone]  Information discussed with and copy given to patient by: Elvera Lennox, 05/03/2018, 12:53 PM

## 2018-05-03 NOTE — Progress Notes (Addendum)
Physical Therapy Session Note  Patient Details  Name: Dustin Alexander MRN: [id number] Date of Birth: [birth year]  Today's Date: 05/03/2018 PT Individual Time: 4098-1191 PT Individual Time Calculation (min): 68 min   Short Term Goals: Week 1:  PT Short Term Goal 1 (Week 1): = LTG  Skilled Therapeutic Interventions/Progress Updates:  Pt received in bed & agreeable to tx. Pt noted pain but declined request for pain medication. Provided pt with standard w/c cushion (no manual 20x18 w/c available so pt continues to use reclining back w/c) and pt reports new cushion feels better than previous one. Provided pt with home measurement sheet to ensure w/c will fit in his house. Pt's wife present for session. Pt completes bed mobility with and without hospital bed features, extra time, and supervision. Pt completes slide board transfers bed<>w/c and w/c<>mat table with set up assist for slide board and close supervision overall. Educated pt & wife on w/c parts management (arm rest, leg rests). Provided pt with LE HEP & pt performed the following: ankle circles, short arc quads, heel slides all with cuing for proper technique. Pt educated to perform exercises in comfortable range; pt unable to complete all exercises with RLE 2/2 pain. Pt required break 2/2 pain and finds most relief in sidelying. At end of session pt left sitting on EOB with wife present, bed alarm set, and all needs within reach.  At end of session pt reporting 8.5/10 pain but declining pain meds - RN made aware.  Therapy Documentation Precautions:  Precautions Precautions: Shoulder Type of Shoulder Precautions: no ROM to R shoulder Shoulder Interventions: Shoulder sling/immobilizer, For comfort Required Braces or Orthoses: Sling Restrictions Weight Bearing Restrictions: Yes RUE Weight Bearing: Non weight bearing RLE Weight Bearing: Non weight bearing LLE Weight Bearing: Touchdown weight bearing Other Position/Activity Restrictions:  for transfers only   See Function Navigator for Current Functional Status.   Therapy/Group: Individual Therapy  Sandi Mariscal 05/03/2018, 4:13 PM
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
## 2018-05-04 ENCOUNTER — Inpatient Hospital Stay (HOSPITAL_COMMUNITY): Payer: Self-pay | Admitting: Physical Therapy

## 2018-05-04 ENCOUNTER — Encounter (HOSPITAL_COMMUNITY): Payer: Self-pay

## 2018-05-04 ENCOUNTER — Inpatient Hospital Stay (HOSPITAL_COMMUNITY): Payer: Self-pay | Admitting: Occupational Therapy

## 2018-05-04 ENCOUNTER — Inpatient Hospital Stay (HOSPITAL_COMMUNITY): Payer: Self-pay

## 2018-05-04 ENCOUNTER — Encounter (HOSPITAL_COMMUNITY): Payer: Self-pay | Admitting: Psychology

## 2018-05-04 DIAGNOSIS — S3282XA Multiple fractures of pelvis without disruption of pelvic ring, initial encounter for closed fracture: Secondary | ICD-10-CM

## 2018-05-04 DIAGNOSIS — K5901 Slow transit constipation: Secondary | ICD-10-CM

## 2018-05-04 LAB — GLUCOSE, CAPILLARY
GLUCOSE-CAPILLARY: 107 mg/dL — AB (ref 65–99)
Glucose-Capillary: 123 mg/dL — ABNORMAL HIGH (ref 65–99)
Glucose-Capillary: 118 mg/dL — ABNORMAL HIGH (ref 65–99)
Glucose-Capillary: 110 mg/dL — ABNORMAL HIGH (ref 65–99)

## 2018-05-04 MED ORDER — SORBITOL 70 % SOLN
60.0000 mL | Status: AC
  Administered 2018-05-04: 60 mL via ORAL
  Filled 2018-05-04: qty 60

## 2018-05-04 NOTE — Progress Notes (Signed)
Pt has refused enema at this time.  Feel like that will go later tonight.  Educated risk and benefits.  Will revisit in AM. Will continue to monitor.
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
## 2018-05-04 NOTE — Progress Notes (Signed)
Occupational Therapy Note  Patient Details  Name: Dustin Alexander MRN: [id number] Date of Birth: 08-09-1971  Today's Date: 05/04/2018 OT Individual Time: 4098-1191 OT Individual Time Calculation (min): 69 min   Upon entering the room, pt supine in bed with reports of increased fatigue from prior therapy session. Pt with new orders from physician giving approval for shower. OT discussing plan to address this tomorrow as pt declines this task today. Pt showing pictures of home and providing measurements for discharge planning. Discussion of possible equipment needs and follow up with pt. OT provided paper handout regarding energy conservation strategies for self care, home management, and community mobility. Pt verbalized understanding and asking appropriate questions. Pt talking through various examples in regards to this topic. RN arrived for dressing changes. OT exited the room with call bell and all needed items within pt's reach.    Alen Bleacher 05/04/2018, 12:33 PM

## 2018-05-05 ENCOUNTER — Inpatient Hospital Stay (HOSPITAL_COMMUNITY): Payer: Self-pay | Admitting: Occupational Therapy

## 2018-05-05 ENCOUNTER — Inpatient Hospital Stay (HOSPITAL_COMMUNITY): Payer: Self-pay | Admitting: Physical Therapy

## 2018-05-05 ENCOUNTER — Encounter (HOSPITAL_COMMUNITY): Payer: Self-pay

## 2018-05-05 DIAGNOSIS — K5901 Slow transit constipation: Secondary | ICD-10-CM

## 2018-05-05 DIAGNOSIS — R7989 Other specified abnormal findings of blood chemistry: Secondary | ICD-10-CM

## 2018-05-05 DIAGNOSIS — R945 Abnormal results of liver function studies: Secondary | ICD-10-CM

## 2018-05-05 LAB — GLUCOSE, CAPILLARY
GLUCOSE-CAPILLARY: 123 mg/dL — AB (ref 65–99)
Glucose-Capillary: 94 mg/dL (ref 65–99)
Glucose-Capillary: 106 mg/dL — ABNORMAL HIGH (ref 65–99)
Glucose-Capillary: 127 mg/dL — ABNORMAL HIGH (ref 65–99)

## 2018-05-05 MED ORDER — ENOXAPARIN (LOVENOX) PATIENT EDUCATION KIT
PACK | Freq: Once | Status: DC
  Filled 2018-05-05: qty 1

## 2018-05-05 MED ORDER — MAGNESIUM CITRATE PO SOLN
1.0000 | Freq: Once | ORAL | Status: AC
  Administered 2018-05-05: 1 via ORAL
  Filled 2018-05-05: qty 296

## 2018-05-05 NOTE — Progress Notes (Addendum)
Physical Therapy Session Note  Patient Details  Name: Dustin Alexander MRN: [id number] Date of Birth: 02-Jan-1971  Today's Date: 05/05/2018 PT Individual Time: 0915-1015 PT Individual Time Calculation (min): 60 min   Short Term Goals: Week 1:  PT Short Term Goal 1 (Week 1): = LTG  Skilled Therapeutic Interventions/Progress Updates:    Pt semi-reclined in bed, agreeable to PT. Pt reports 7/10 pain in his back, just received pain medication from RN. Sliding board transfer bed to w/c with setup assist, v/c to adhere to NWBing on RLE. Sliding board transfer w/c to/from mat table x 4 reps to various heights and both directions with setup assist. Pt has more difficulty and needs increased time and cues to transfer to the R vs the L. Sit to/from supine with Supervision and increased time. Supine BLE therex x 10 reps: quad sets, SLR, hip abd, heel slides, SAQ. Seated BLE therex x 10 reps: marches, LAQ, heel/toe raises. Assisted pt back to bed at end of session per pt request as uncomfortable to sit in w/c for extended periods of time, sliding board transfer with setup assist. Pt left semi-reclined in bed with needs in reach.  Therapy Documentation Precautions:  Precautions Precautions: Shoulder Type of Shoulder Precautions: no ROM to R shoulder Shoulder Interventions: Shoulder sling/immobilizer, For comfort Required Braces or Orthoses: Sling Restrictions Weight Bearing Restrictions: Yes RUE Weight Bearing: Non weight bearing RLE Weight Bearing: Non weight bearing LLE Weight Bearing: Touchdown weight bearing Other Position/Activity Restrictions: for transfers only  See Function Navigator for Current Functional Status.   Therapy/Group: Individual Therapy  Peter Congo, PT, DPT  05/05/2018, 12:12 PM
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
## 2018-05-05 NOTE — Progress Notes (Signed)
Social Work Discharge Note  The overall goal for the admission was met for:   Discharge location: Yes - home with wife and her parents  Length of Stay: Yes - 5 days  Discharge activity level: Yes - supervision to modified independent  Home/community participation: Yes  Services provided included: MD, RD, PT, OT, RN, Pharmacy, Neuropsych and SW  Financial Services: Other: Pt is self pay.  Follow-up services arranged: DME: 20"x18" lightweight wheelchair with ELRs and basic cushion; 30" slide board; drop arm commode from [place]  Other: Pt will wait to see if he needs home or outpt therapies, as this will be private pay, and he will contact CSW to arrange if needed. and Patient/Family has no preference for HH/DME agencies  Comments (or additional information):  CSW gave pt MATCH program for 1st month of medications.  Also gave him signed handicapped placard application for the car.  Pt also has information on pt assistance program for his medical bills.  Patient/Family verbalized understanding of follow-up arrangements: Yes  Individual responsible for coordination of the follow-up plan: pt and his wife  Confirmed correct DME delivered: Trey Sailors 05/05/2018    Srinidhi Landers, Silvestre Mesi

## 2018-05-05 NOTE — Progress Notes (Signed)
Social Work Patient ID: Dustin Alexander, male   DOB: Apr 19, 1971, 47 y.o.   MRN: [id number]   CSW met with pt and his wife 05-03-18 to update them on team conference discussion and targeted d/c date of 05-07-18.  They are pleased pt has progressed to the point of being able to go home soon.  CSW also talked with him today about d/c plans, DME, f/u therapies, MATCH, handicap placard, etc.  Pt was appreciative of the support he has received on CIR and he plans to come back to see [place] when he is coming for f/u appointments.  CSW remains available to assist as needed.
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
## 2018-05-05 NOTE — Progress Notes (Signed)
Occupational Therapy Session Note  Patient Details  Name: Dustin Alexander MRN: [id number] Date of Birth: 06-11-1971  Today's Date: 05/05/2018 OT Individual Time: 4098-1191 and 4782-9562 OT Individual Time Calculation (min): 87 min and 43 min    Short Term Goals: Week 1:  OT Short Term Goal 1 (Week 1): STG=LTG d/t ELOS  Skilled Therapeutic Interventions/Progress Updates:    Session 1: Upon entering the room, pt supine in bed with no c/o pain this session and agreeable to OT intervention. Supine >sit with supervision. Pt performed slideboard transfer from bed >wheelchair with supervision and min verbal cues to maintain precautions. Pt stating, " Well when I go home I'm not gonna do it that way." OT educated pt on importance of maintaining precautions with mobility. Pt then performed slide board transfer onto TTB with supervison as well. Lateral leans to remove clothing and OT provided pt with LH sponge in order to increase I with LB bathing. Pt exiting the shower and transferred back to EOB to don clothing items. Pt continues to need verbal cues to maintain precautions with UE and B LEs. Pt returned to supine at end of session secondary to fatigue. Call bell and all needed items within reach upon exiting the room.   Session 2: Upon entering the room, pt supine in bed with c/o soreness and fatigue. Pt agreeable to OT intervention. Pt required assistance getting wheelchair into bathroom and pt able to set up slide board transfer to drop arm commode chair <>wheelchair with close supervision. OT discussed lateral leans for clothing management and hygiene but pt declined to practice at this time. Pt returned to wheelchair and performed slide board transfer in same manner back to bed. OT educated pt on need for this at home and pt was agreeable. OT discussed no need for follow up OT services and pt was agreeable. Pt remained supine in bed with call bell and all needed items within reach. Bed alarm activated.   Therapy Documentation Precautions:  Precautions Precautions: Shoulder Type of Shoulder Precautions: no ROM to R shoulder Shoulder Interventions: Shoulder sling/immobilizer, For comfort Required Braces or Orthoses: Sling Restrictions Weight Bearing Restrictions: Yes RUE Weight Bearing: Non weight bearing RLE Weight Bearing: Non weight bearing LLE Weight Bearing: Touchdown weight bearing Other Position/Activity Restrictions: for transfers only General:   Vital Signs:   Pain: Pain Assessment Pain Score: Asleep ADL: ADL ADL Comments: See functional navigator Vision   Perception    Praxis   Exercises:   Other Treatments:    See Function Navigator for Current Functional Status.   Therapy/Group: Individual Therapy  Alen Bleacher 05/05/2018, 8:32 AM
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
## 2018-05-06 ENCOUNTER — Inpatient Hospital Stay (HOSPITAL_COMMUNITY): Payer: Self-pay

## 2018-05-06 ENCOUNTER — Inpatient Hospital Stay (HOSPITAL_COMMUNITY): Payer: Self-pay | Admitting: Occupational Therapy

## 2018-05-06 ENCOUNTER — Inpatient Hospital Stay (HOSPITAL_COMMUNITY): Payer: Self-pay | Admitting: Physical Therapy

## 2018-05-06 DIAGNOSIS — S3282XA Multiple fractures of pelvis without disruption of pelvic ring, initial encounter for closed fracture: Secondary | ICD-10-CM

## 2018-05-06 LAB — BASIC METABOLIC PANEL
Anion gap: 9 (ref 5–15)
BUN: 28 mg/dL — ABNORMAL HIGH (ref 6–20)
CALCIUM: 9.2 mg/dL (ref 8.9–10.3)
CO2: 28 mmol/L (ref 22–32)
Chloride: 101 mmol/L (ref 101–111)
Creatinine, Ser: 0.89 mg/dL (ref 0.61–1.24)
GFR calc Af Amer: >60 mL/min (ref >60)
Glucose, Bld: 123 mg/dL — ABNORMAL HIGH (ref 65–99)
Potassium: 3.9 mmol/L (ref 3.5–5.1)
Sodium: 138 mmol/L (ref 135–145)

## 2018-05-06 LAB — CBC
HCT: 34.7 % — ABNORMAL LOW (ref 39.0–52.0)
HEMOGLOBIN: 11.6 g/dL — AB (ref 13.0–17.0)
MCH: 30.2 pg (ref 26.0–34.0)
MCHC: 33.4 g/dL (ref 30.0–36.0)
MCV: 90.4 fL (ref 78.0–100.0)
Platelets: 411 10*3/uL — ABNORMAL HIGH (ref 150–400)
RBC: 3.84 MIL/uL — ABNORMAL LOW (ref 4.22–5.81)
RDW: 13 % (ref 11.5–15.5)
WBC: 9.1 10*3/uL (ref 4.0–10.5)

## 2018-05-06 LAB — GLUCOSE, CAPILLARY
GLUCOSE-CAPILLARY: 114 mg/dL — AB (ref 65–99)
Glucose-Capillary: 133 mg/dL — ABNORMAL HIGH (ref 65–99)
Glucose-Capillary: 101 mg/dL — ABNORMAL HIGH (ref 65–99)

## 2018-05-06 MED ORDER — TRAMADOL HCL 50 MG PO TABS: 50.0000 mg | ORAL_TABLET | Freq: Four times a day (QID) | ORAL | 0 refills | Status: AC | PRN

## 2018-05-06 MED ORDER — ENOXAPARIN SODIUM 40 MG/0.4ML ~~LOC~~ SOLN: 40.0000 mg | SUBCUTANEOUS | 0 refills | Status: AC

## 2018-05-06 MED ORDER — TRAMADOL HCL 50 MG PO TABS: 50.0000 mg | ORAL_TABLET | Freq: Four times a day (QID) | ORAL | Status: DC | PRN

## 2018-05-06 MED ORDER — ACETAMINOPHEN 325 MG PO TABS: 650.0000 mg | ORAL_TABLET | Freq: Four times a day (QID) | ORAL | Status: AC

## 2018-05-06 MED ORDER — METHOCARBAMOL 500 MG PO TABS: 500.0000 mg | ORAL_TABLET | Freq: Four times a day (QID) | ORAL | 0 refills | Status: AC | PRN

## 2018-05-06 MED ORDER — POLYETHYLENE GLYCOL 3350 17 G PO PACK: 17.0000 g | PACK | Freq: Two times a day (BID) | ORAL | 0 refills | Status: AC

## 2018-05-06 MED ORDER — COLLAGENASE 250 UNIT/GM EX OINT: TOPICAL_OINTMENT | Freq: Every day | CUTANEOUS | 0 refills | Status: AC

## 2018-05-06 MED ORDER — OXYCODONE HCL 10 MG PO TABS: 5.0000 mg | ORAL_TABLET | Freq: Four times a day (QID) | ORAL | 0 refills | Status: DC | PRN

## 2018-05-06 NOTE — Progress Notes (Signed)
Physical Therapy Discharge Summary  Patient Details  Name: Dustin Alexander MRN: [id number] Date of Birth: March 03, 1971  Today's Date: 05/06/2018 PT Individual Time: [phone]   60 min    Patient has met 7 of 7 long term goals due to improved activity tolerance, improved balance, increased strength, increased range of motion, decreased pain and ability to compensate for deficits.  Patient to discharge at a wheelchair level Supervision.   Patient's care partner is independent to provide the necessary physical assistance at discharge.  Reasons goals not met: all PT goals met   Recommendation:  Patient will benefit from ongoing skilled PT services in home health setting to continue to advance safe functional mobility, address ongoing impairments in safety, transfers, and minimize fall risk.  Equipment: WC. SB.   Reasons for discharge: treatment goals met and discharge from hospital  Patient/family agrees with progress made and goals achieved: Yes   PT treatment: PT instructed pt in Grad day assessment to measure progress toward goals. See below for details.   PT Discharge Precautions/Restrictions Precautions Precautions: Shoulder Type of Shoulder Precautions: no ROM to R shoulder Shoulder Interventions: Shoulder sling/immobilizer;For comfort Required Braces or Orthoses: Sling Restrictions Weight Bearing Restrictions: Yes RUE Weight Bearing: Non weight bearing RLE Weight Bearing: Non weight bearing LLE Weight Bearing: Touchdown weight bearing Other Position/Activity Restrictions: for transfers only Vital Signs Therapy Vitals Temp: 98 F (36.7 C) Temp Source: Oral Pulse Rate: 72 Resp: 10 BP: 113/60 Patient Position (if appropriate): Lying Oxygen Therapy SpO2: 98 % O2 Device: Room Air Pain Pain Assessment Pain Scale: 0-10 Pain Score: 5  Pain Location: Shoulder Pain Orientation: Right Pain Descriptors / Indicators: Aching Pain Onset: On-going Pain Intervention(s):  Distraction;Emotional support Vision/Perception  Perception Perception: Within Functional Limits Praxis Praxis: Intact  Cognition Overall Cognitive Status: Within Functional Limits for tasks assessed Arousal/Alertness: Awake/alert Orientation Level: Oriented X4 Attention: Selective Selective Attention: Appears intact Memory: Appears intact Awareness: Appears intact Problem Solving: Appears intact Safety/Judgment: Appears intact Sensation Sensation Light Touch: Appears Intact Proprioception: Appears Intact Coordination Gross Motor Movements are Fluid and Coordinated: Yes Fine Motor Movements are Fluid and Coordinated: Yes Finger Nose Finger Test: WFL L UE Motor  Motor Motor: Within Functional Limits Motor - Skilled Clinical Observations: movement limited by pain  Mobility Bed Mobility Bed Mobility: Sitting - Scoot to Edge of Bed;Sit to Supine;Supine to Sit;Rolling Left Rolling Right: 6: Modified independent (Device/Increase time) Rolling Left: 6: Modified independent (Device/Increase time) Supine to Sit: 6: Modified independent (Device/Increase time) Supine to Sit Details: Verbal cues for precautions/safety Sitting - Scoot to Edge of Bed: 6: Modified independent (Device/Increase time) Sit to Supine: 6: Modified independent (Device/Increase time) Locomotion    WC mobility: mod I >156f  Trunk/Postural Assessment  Cervical Assessment Cervical Assessment: Within Functional Limits Thoracic Assessment Thoracic Assessment: Within Functional Limits Lumbar Assessment Lumbar Assessment: (sacral fx) Postural Control Postural Control: (limited by pain)  Balance Balance Balance Assessed: Yes Static Sitting Balance Static Sitting - Level of Assistance: 6: Modified independent (Device/Increase time) Dynamic Sitting Balance Sitting balance - Comments: mod I, limited by pain Extremity Assessment  RUE Assessment RUE Assessment: Not tested(No R Shoulder ROM) LUE Assessment LUE  Assessment: Within Functional Limits  RLE: at least 3/5 with functional movement.   LLE: at least 3/5 with functional movement.    See Function Navigator for Current Functional Status.  ALorie Phenix5/17/2019, 9:18 AM
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
## 2018-05-06 NOTE — Plan of Care (Signed)
All LTGs achieved 05/06/18 

## 2018-05-06 NOTE — Progress Notes (Signed)
Occupational Therapy Session Note  Patient Details  Name: Dustin Alexander MRN: [id number] Date of Birth: 25-Sep-1971  Today's Date: 05/06/2018 OT Individual Time: 5621-3086 OT Individual Time Calculation (min): 76 min   Short Term Goals: Week 1:  OT Short Term Goal 1 (Week 1): STG=LTG d/t ELOS  Skilled Therapeutic Interventions/Progress Updates:    Pt greeted supine in bed, agreeable to bathing/dressing at shower level. All slideboard transfers completed with supervision/setup. Pt bathed while seated on TTB utilizing lateral leans and AE at Mod I level. After transferring via slideboard<w/c<EOB, he proceeded to dress at Mod I level with extra time utilizing 1 handed methods.Throughout session, pt able to verbalize and demonstrate carryover of his multiple precautions. At end of tx he returned to supine. We discussed f/u recommendations and DME needs. Pt verbalized understanding. At end of session he was left in bed with all needs within reach.    Therapy Documentation Precautions:  Precautions Precautions: Shoulder Type of Shoulder Precautions: no ROM to R shoulder Shoulder Interventions: Shoulder sling/immobilizer, For comfort Required Braces or Orthoses: Sling Restrictions Weight Bearing Restrictions: Yes RUE Weight Bearing: Non weight bearing RLE Weight Bearing: Non weight bearing LLE Weight Bearing: Touchdown weight bearing Other Position/Activity Restrictions: for transfers only Pain: Pain Assessment Pain Scale: 0-10 Pain Score: 5  Pain Location: Shoulder Pain Orientation: Right ADL: ADL ADL Comments: Please see functional navigator for ADL status Vision Baseline Vision/History: No visual deficits;Wears glasses(or contacts) Wears Glasses: At all times Patient Visual Report: No change from baseline Vision Assessment?: No apparent visual deficits Perception  Perception: Within Functional Limits Praxis Praxis: Intact  See Function Navigator for Current Functional  Status.   Therapy/Group: Individual Therapy  Miguel Christiana A Jamillah Camilo 05/06/2018, 12:40 PM

## 2018-05-06 NOTE — Progress Notes (Signed)
Occupational Therapy Discharge Summary  Patient Details  Name: Dustin Alexander MRN: [id number] Date of Birth: 1971/03/19  Today's Date: 05/06/2018 OT Individual Time: 2376-2831 OT Individual Time Calculation (min): 56 min    Patient has met 7 of 7 long term goals due to improved activity tolerance and ability to compensate for deficits.  Patient to discharge at overall Modified Independent level.  Patient's care partner is independent to provide the necessary physical assistance at discharge.    Reasons goals not met: N/A   Recommendation:  Patient does not require any further OT intervention.   Equipment: Pt provided with w/c, TTB, and BSC  Reasons for discharge: treatment goals met and discharge from hospital  Patient/family agrees with progress made and goals achieved: Yes  OT Discharge Pt received supine in bed agreeable to therapy. Discussion with pt re OT POC, d/c planning, and AD/AE to be taken home. Pt transferred from supine to EOB with mod I, although needing vc for adherence to NWB R LE precautions during bed mobility. Pt edu re mechanics of bed mobility and clarified weightbearing. Pt completed slideboard transfer to and from w/c and BSC with mod I. Edu provided re importance of positioning slideboard for safety and fall reduction. Pt was transported to therapy gym and sat edge of mat, completing L UE strengthening circuit with 10lb weight. Pt provided with theraband and edu re importance of wellbeing and staying active on engagement in ADLs/IADLs. Pt returned to room and left in supine with bed alarms set.   Precautions/Restrictions  Precautions Precautions: Shoulder Type of Shoulder Precautions: no ROM to R shoulder Shoulder Interventions: Shoulder sling/immobilizer;For comfort Required Braces or Orthoses: Sling Restrictions Weight Bearing Restrictions: Yes RUE Weight Bearing: Non weight bearing RLE Weight Bearing: Non weight bearing LLE Weight Bearing: Touchdown  weight bearing Other Position/Activity Restrictions: for transfers only   Pain Pain Assessment Pain Scale: 0-10 Pain Score: 5  Pain Location: Shoulder Pain Orientation: Right ADL ADL ADL Comments: Please see functional navigator for ADL status Vision Baseline Vision/History: No visual deficits;Wears glasses(or contacts) Wears Glasses: At all times Patient Visual Report: No change from baseline Vision Assessment?: No apparent visual deficits Perception  Perception: Within Functional Limits Praxis Praxis: Intact Cognition Overall Cognitive Status: Within Functional Limits for tasks assessed Arousal/Alertness: Awake/alert Orientation Level: Oriented X4 Attention: Selective Selective Attention: Appears intact Memory: Appears intact Awareness: Appears intact Problem Solving: Appears intact Safety/Judgment: Appears intact Sensation Sensation Light Touch: Appears Intact Proprioception: Appears Intact Coordination Gross Motor Movements are Fluid and Coordinated: Yes Fine Motor Movements are Fluid and Coordinated: Yes Finger Nose Finger Test: WFL L UE Motor  Motor Motor: Within Functional Limits Motor - Discharge Observations: Functional movements limited by multiple WB restrictions  Mobility  Transfers Transfers: (Lateral scoot transfer to TTB using Slideboard with supervision/setup)  Trunk/Postural Assessment  Cervical Assessment Cervical Assessment: Within Functional Limits Thoracic Assessment Thoracic Assessment: Within Functional Limits  Balance Balance Balance Assessed: Yes Dynamic Sitting Balance Dynamic Sitting - Level of Assistance: 6: Modified independent (Device/Increase time) Sitting balance - Comments: mod I bathing on TTB Extremity/Trunk Assessment RUE Assessment RUE Assessment: Exceptions to WFL(due to R UE precautions) LUE Assessment LUE Assessment: Within Functional Limits   See Function Navigator for Current Functional Status.  Curtis Sites 05/06/2018, 12:07 PM

## 2018-05-06 NOTE — Progress Notes (Signed)
Educated patient on Lovenox injections. Patient was able to verbalize understanding while repeating to this RN the steps included in self-injecting this shot.

## 2018-05-06 NOTE — Discharge Instructions (Signed)
Inpatient Rehab Discharge Instructions  Dustin Alexander Discharge date and time: 05/07/18   Activities/Precautions/ Functional Status: Activity: no lifting, driving, or strenuous exercise till cleared by MD. NO walking. No weight on right leg or right arm. Touch down weigh on left leg.  Diet: diabetic diet Wound Care: wash with soap and water. Apply santyl with damp to dry dressing to dark/yellow slough on left knee. Change daily.    Functional status:  ___ No restrictions     ___ Walk up steps independently ___ 24/7 supervision/assistance   ___ Walk up steps with assistance _X__ Intermittent supervision/assistance  ___ Bathe/dress independently ___ Walk with walker     _X__ Bathe/dress with assistance ___ Walk Independently    ___ Shower independently ___ Walk with assistance    ___ Shower with assistance _X__ No alcohol     ___ Return to work/school ________   COMMUNITY REFERRALS UPON DISCHARGE:   Home Health:  Since you do not have insurance to assist with payment of home or outpatient therapies, you and Boneta Lucks decided to see how things go at home and you will call Boneta Lucks if you decide to add home therapies. Outpatient: As above. Medical Equipment/Items Ordered:  20"x18" wheelchair with elevating leg rests and basic cushion; 30" slide board; tub transfer bench; drop arm commode  Agency/Supplier:  [facility]      Phone:  [phone]  GENERAL COMMUNITY RESOURCES FOR PATIENT/FAMILY: MATCH program to assist with medications - Card Boneta Lucks gave you. Patient Accounting to assist with medical bills  [phone]  Special Instructions: 1. Monitor blood sugars before meals and at bedtime.  Resume metformin at 1/2 pill twice a day. Increase to whole pill twice a day if blood sugars are over 150. Talk to primary MD before resuming insulin.    My questions have been answered and I understand these instructions. I will adhere to these goals and the provided educational materials after  my discharge from the hospital.  Patient/Caregiver Signature _______________________________ Date __________  Clinician Signature _______________________________________ Date __________  Please bring this form and your medication list with you to all your follow-up doctor's appointments.

## 2018-05-06 NOTE — Progress Notes (Signed)
Bilateral lower extremity venous duplex has been completed. Negative for DVT. There is evidence of age indeterminate superficial vein thrombosis involving the short saphenous vein of the right lower extremity.  Results were given to the patient's nurse, Misty Stanley.  05/06/18 2:30 PM Olen Cordial RVT

## 2018-05-07 DIAGNOSIS — D62 Acute posthemorrhagic anemia: Secondary | ICD-10-CM

## 2018-05-07 DIAGNOSIS — T07XXXA Unspecified multiple injuries, initial encounter: Secondary | ICD-10-CM

## 2018-05-07 DIAGNOSIS — E1169 Type 2 diabetes mellitus with other specified complication: Secondary | ICD-10-CM

## 2018-05-07 DIAGNOSIS — E669 Obesity, unspecified: Secondary | ICD-10-CM

## 2018-05-07 LAB — GLUCOSE, CAPILLARY: GLUCOSE-CAPILLARY: 157 mg/dL — AB (ref 65–99)

## 2018-05-07 NOTE — Progress Notes (Signed)
Patient administered lovenox injection to self this morning without any difficulty. Discharge instructions given yesterday and patient denied any questions. Dressings to Bilateral knees changed and education on dressing instructions reinforced with patient. Patient discharged home about [phone] with family and all belongings/paperwork.

## 2018-05-07 NOTE — Progress Notes (Signed)
Dustin Alexander is a 47 y.o. male 1971-06-12 [id number]  Subjective: No new complaints. No new problems. Slept well. Feeling OK.  Objective: Vital signs in last 24 hours: Temp:  [98.3 F (36.8 C)-98.8 F (37.1 C)] 98.8 F (37.1 C) (05/18 0353) Pulse Rate:  [62-90] 90 (05/18 0353) Resp:  [17-20] 18 (05/18 0353) BP: (112-114)/(65-66) 113/66 (05/18 0353) SpO2:  [97 %-100 %] 100 % (05/18 0353) Weight change:  Last BM Date: 05/05/18  Intake/Output from previous day: 05/17 0701 - 05/18 0700 In: 1080 [P.O.:1080] Out: 3425 [Urine:3425] Last cbgs: CBG (last 3)  Recent Labs    05/06/18 1147 05/06/18 2138 05/07/18 0613  GLUCAP 101* 114* 157*     Physical Exam General: No apparent distress   HEENT: not dry Lungs: Normal effort. Lungs clear to auscultation, no crackles or wheezes. Cardiovascular: Regular rate and rhythm, no edema Abdomen: S/NT/ND; BS(+) Musculoskeletal:  unchanged Neurological: No new neurological deficits Wounds: N/A    Skin: clear  - wounds w/scabs Mental state: Alert, oriented, cooperative    Lab Results: BMET    Component Value Date/Time   NA 138 05/06/2018 0608   K 3.9 05/06/2018 0608   CL 101 05/06/2018 0608   CO2 28 05/06/2018 0608   GLUCOSE 123 (H) 05/06/2018 0608   BUN 28 (H) 05/06/2018 0608   CREATININE 0.89 05/06/2018 0608   CALCIUM 9.2 05/06/2018 0608   GFRNONAA >60 05/06/2018 0608   GFRAA >60 05/06/2018 0608   CBC    Component Value Date/Time   WBC 9.1 05/06/2018 0608   RBC 3.84 (L) 05/06/2018 0608   HGB 11.6 (L) 05/06/2018 0608   HCT 34.7 (L) 05/06/2018 0608   PLT 411 (H) 05/06/2018 0608   MCV 90.4 05/06/2018 0608   MCH 30.2 05/06/2018 0608   MCHC 33.4 05/06/2018 0608   RDW 13.0 05/06/2018 0608   LYMPHSABS 1.7 05/03/2018 0540   MONOABS 0.6 05/03/2018 0540   EOSABS 0.2 05/03/2018 0540   BASOSABS 0.0 05/03/2018 0540    Studies/Results: No results found.  Medications: I have reviewed the patient's current  medications.  Assessment/Plan:    1.Multiple fx's. Will d/c this am 2. Pain control - Tylenol prn 3. Road rash - Vaseline to scabs 4. HTN: Norvasc, HCTZ, Cozaar 5. DM2. On diet 6. Anemia - OP re-check of Hgb      Length of stay, days: 5  Sonda Primes , MD 05/07/2018, 7:55 AM

## 2018-05-08 NOTE — Progress Notes (Signed)
Pt's wife called unit last night regarding message left by Morrie Sheldon, Charity fundraiser. Wife instructed by this RN to have Ortho doctor look on pt's suture on nose upon return to clinic.

## 2018-05-09 ENCOUNTER — Telehealth: Payer: Self-pay

## 2018-05-09 ENCOUNTER — Telehealth: Payer: Self-pay | Admitting: Physical Medicine & Rehabilitation

## 2018-05-09 LAB — GLUCOSE, CAPILLARY: Glucose-Capillary: 99 mg/dL (ref 65–99)

## 2018-05-09 MED ORDER — OXYCODONE HCL 10 MG PO TABS: 5.0000 mg | ORAL_TABLET | Freq: Four times a day (QID) | ORAL | 0 refills | Status: DC | PRN

## 2018-05-09 MED ORDER — OXYCODONE HCL 10 MG PO TABS: 5.0000 mg | ORAL_TABLET | Freq: Four times a day (QID) | ORAL | 0 refills | Status: AC | PRN

## 2018-05-09 NOTE — Telephone Encounter (Signed)
PMP Aware Web-Site- checked, last prescription in 2017.

## 2018-05-09 NOTE — Telephone Encounter (Signed)
Patients wife called, stated was unable to pick up oxycodone for this patient as the insurance company did not except the plan they left the hospital with, match program.  advised her of the GoodRx discount card and she requested we have a new prescription sent to the [facility] in [facility] on [address].

## 2018-05-09 NOTE — Telephone Encounter (Signed)
Called pharmacy and cancelled original order of oxycodone

## 2018-05-09 NOTE — Telephone Encounter (Signed)
Per P Love no foll up needed for zs advised ptn's wife no follow up here
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
